# Patient Record
Sex: Male | Born: 2005 | Race: Black or African American | Hispanic: No | Marital: Single | Smoking: Former smoker
Health system: Southern US, Community
[De-identification: ages and names within clinical notes are randomized; demographics above are authoritative.]

---
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
## 2024-01-02 ENCOUNTER — Ambulatory Visit (HOSPITAL_COMMUNITY): Payer: Self-pay

## 2024-01-02 LAB — CYTOLOGY, (ORAL, ANAL, URETHRAL) ANCILLARY ONLY
Chlamydia: NEGATIVE
Comment: NEGATIVE
Comment: NEGATIVE
Comment: NORMAL
Neisseria Gonorrhea: NEGATIVE
Trichomonas: POSITIVE — AB

## 2024-01-02 MED ORDER — METRONIDAZOLE 500 MG PO TABS: 2000.0000 mg | ORAL_TABLET | Freq: Once | ORAL | 0 refills | Status: AC

## 2024-05-05 ENCOUNTER — Ambulatory Visit

## 2024-05-06 ENCOUNTER — Ambulatory Visit
Admission: EM | Admit: 2024-05-06 | Discharge: 2024-05-06 | Disposition: A | Discharge status: Home / Self Care | Attending: Family Medicine | Admitting: Family Medicine

## 2024-05-06 DIAGNOSIS — Z113 Encounter for screening for infections with a predominantly sexual mode of transmission: Secondary | ICD-10-CM | POA: Diagnosis not present

## 2024-05-06 DIAGNOSIS — Z202 Contact with and (suspected) exposure to infections with a predominantly sexual mode of transmission: Secondary | ICD-10-CM | POA: Insufficient documentation

## 2024-05-06 MED ORDER — METRONIDAZOLE 500 MG PO TABS: 500.0000 mg | ORAL_TABLET | Freq: Two times a day (BID) | ORAL | 0 refills | Status: DC

## 2024-05-06 NOTE — ED Provider Notes (Signed)
  [facility]  Note:  This document was prepared using Conservation officer, historic buildings and may include unintentional dictation errors.  MRN: [id number] DOB: [birth year]  Subjective:   Blake Gutierrez is a 18 y.o. male presenting for STI screening.  Patient had sex with someone that tested positive for trichomonas.  Denies dysuria, hematuria, urinary frequency, penile discharge, penile swelling, testicular pain, testicular swelling, anal pain, groin pain.  Refuses HIV and syphilis testing.  No chronic medications.  No Known Allergies  Past Medical History:  Diagnosis Date   Erb's palsy Birth   Occupational Therapy     History reviewed. No pertinent surgical history.  Family History  Problem Relation Age of Onset   Heart Problems Mother    Hypertension Mother    Hypertension Other    Cancer Other    Diabetes Other     Social History   Tobacco Use   Smoking status: Former    Types: E-cigarettes    Passive exposure: Yes   Smokeless tobacco: Never  Vaping Use   Vaping status: Former   Substances: Nicotine, THC, Flavoring  Substance Use Topics   Alcohol use: Never   Drug use: Yes    Types: Marijuana    Comment: occasionally    ROS   Objective:   Vitals: BP (!) 126/1 (BP Location: Right Arm)   Pulse 71   Temp 98.8 F (37.1 C) (Oral)   Resp 18   SpO2 96%   Physical Exam Constitutional:      General: He is not in acute distress.    Appearance: Normal appearance. He is well-developed and normal weight. He is not ill-appearing, toxic-appearing or diaphoretic.  HENT:     Head: Normocephalic and atraumatic.     Right Ear: External ear normal.     Left Ear: External ear normal.     Nose: Nose normal.     Mouth/Throat:     Pharynx: Oropharynx is clear.  Eyes:     General: No scleral icterus.       Right eye: No discharge.        Left eye: No discharge.     Extraocular Movements: Extraocular movements intact.  Cardiovascular:      Rate and Rhythm: Normal rate.  Pulmonary:     Effort: Pulmonary effort is normal.  Musculoskeletal:     Cervical back: Normal range of motion.  Neurological:     Mental Status: He is alert and oriented to person, place, and time.  Psychiatric:        Mood and Affect: Mood normal.        Behavior: Behavior normal.        Thought Content: Thought content normal.        Judgment: Judgment normal.     Assessment and Plan :   PDMP not reviewed this encounter.  1. Screen for STD (sexually transmitted disease)   2. Exposure to trichomonas    Will treat empirically for trichomonas given his exposure.  Labs pending, will treat as appropriate otherwise based off of results.  Counseled patient on potential for adverse effects with medications prescribed/recommended today, ER and return-to-clinic precautions discussed, patient verbalized understanding.    Christopher Savannah, PA-C 05/06/24 1020

## 2024-05-06 NOTE — Discharge Instructions (Signed)
 Avoid all forms of sexual intercourse (oral, vaginal, anal) for the next 7 days to avoid spreading/reinfecting or at least until we can see what kinds of infection results are positive.  Abstaining for 2 weeks would be better but at least 1 week is required.  We will let you know about your test results from the swab we did today and if you need any prescriptions for antibiotics or changes to your treatment from today.   Your test results should be available tomorrow.  Our results team will let you know if you need any kind of treatment for any positive test results beside what we are providing you today.

## 2024-05-06 NOTE — ED Triage Notes (Signed)
 Pt reports positive exposure to Trichomonas.Denies sx.

## 2024-05-07 LAB — CYTOLOGY, (ORAL, ANAL, URETHRAL) ANCILLARY ONLY
Chlamydia: NEGATIVE
Comment: NEGATIVE
Comment: NEGATIVE
Comment: NORMAL
Neisseria Gonorrhea: NEGATIVE
Trichomonas: POSITIVE — AB

## 2024-05-08 ENCOUNTER — Ambulatory Visit (HOSPITAL_BASED_OUTPATIENT_CLINIC_OR_DEPARTMENT_OTHER): Payer: Self-pay

## 2024-05-09 LAB — MISC LABCORP TEST (SEND OUT): Labcorp test code: 180076

## 2024-05-22 ENCOUNTER — Other Ambulatory Visit: Payer: Self-pay

## 2024-05-22 ENCOUNTER — Ambulatory Visit
Admission: EM | Admit: 2024-05-22 | Discharge: 2024-05-22 | Disposition: A | Discharge status: Home / Self Care | Attending: Internal Medicine | Admitting: Internal Medicine

## 2024-05-22 DIAGNOSIS — R0989 Other specified symptoms and signs involving the circulatory and respiratory systems: Secondary | ICD-10-CM

## 2024-05-22 DIAGNOSIS — J069 Acute upper respiratory infection, unspecified: Secondary | ICD-10-CM

## 2024-05-22 DIAGNOSIS — J029 Acute pharyngitis, unspecified: Secondary | ICD-10-CM | POA: Diagnosis not present

## 2024-05-22 DIAGNOSIS — R051 Acute cough: Secondary | ICD-10-CM

## 2024-05-22 LAB — POCT RAPID STREP A (OFFICE): Rapid Strep A Screen: NEGATIVE

## 2024-05-22 LAB — POC SOFIA SARS ANTIGEN FIA: SARS Coronavirus 2 Ag: NEGATIVE

## 2024-05-22 MED ORDER — FLUTICASONE PROPIONATE 50 MCG/ACT NA SUSP: 2.0000 | Freq: Every day | NASAL | 2 refills | Status: AC

## 2024-05-22 MED ORDER — PREDNISONE 20 MG PO TABS: 40.0000 mg | ORAL_TABLET | Freq: Every day | ORAL | 0 refills | Status: AC

## 2024-05-22 MED ORDER — PROMETHAZINE-DM 6.25-15 MG/5ML PO SYRP: 5.0000 mL | ORAL_SOLUTION | Freq: Three times a day (TID) | ORAL | 0 refills | Status: AC | PRN

## 2024-05-22 NOTE — ED Provider Notes (Signed)
 [facility]    CSN: [id number] Arrival date & time: 05/22/24  1251      History   Chief Complaint No chief complaint on file.   HPI Blake Gutierrez is a 18 y.o. male.   18 year old male who presents urgent care with complaints of sore throat, headache, chills, cough and congestion.  He reports that his symptoms started last night.  He reports that he was around a bunch of family on Sunday and is unsure if he was exposed to anything.  He reports that he has a very sore throat that hurts worse when he coughs.  He is also having pain around his jaw and a headache.  He denies any slurred speech, visual changes, facial paralysis.  He is having chills as well.  When he coughs he is also feeling like he has congestion in his chest.     Past Medical History:  Diagnosis Date   Erb's palsy Birth   Occupational Therapy    Patient Active Problem List   Diagnosis Date Noted   Penile discharge 03/11/2023   Erb's palsy     History reviewed. No pertinent surgical history.     Home Medications    Prior to Admission medications   Medication Sig Start Date End Date Taking? Authorizing Provider  fluticasone  (FLONASE ) 50 MCG/ACT nasal spray Place 2 sprays into both nostrils daily. 05/22/24  Yes Cathryn Gallery A, PA-C  predniSONE  (DELTASONE ) 20 MG tablet Take 2 tablets (40 mg total) by mouth daily with breakfast for 3 days. 05/22/24 05/25/24 Yes Litha Lamartina A, PA-C  promethazine -dextromethorphan (PROMETHAZINE -DM) 6.25-15 MG/5ML syrup Take 5 mLs by mouth every 8 (eight) hours as needed for cough. 05/22/24  Yes Aniya Jolicoeur A, PA-C  acetaminophen  (TYLENOL ) 325 MG tablet Take 2 tablets (650 mg total) by mouth every 6 (six) hours as needed. Patient not taking: Reported on 08/08/2022 03/22/18   Delwyn Damien PARAS, PA-C  emtricitabine -tenofovir  (TRUVADA) 200-300 MG tablet Take 1 tablet by mouth daily. 03/15/23   Marlee Lynwood NOVAK, MD  ibuprofen  (ADVIL ,MOTRIN ) 600 MG tablet Take  1 tablet (600 mg total) by mouth every 6 (six) hours as needed. Patient not taking: Reported on 08/08/2022 03/22/18   Delwyn Damien PARAS, PA-C  methocarbamol  (ROBAXIN ) 500 MG tablet Take 2 tablets (1,000 mg total) by mouth 3 (three) times daily as needed for muscle spasms. Patient not taking: Reported on 08/08/2022 06/07/22   Marlee Lynwood NOVAK, MD  ondansetron  (ZOFRAN -ODT) 4 MG disintegrating tablet Take 1 tablet (4 mg total) by mouth every 8 (eight) hours as needed for nausea or vomiting. 10/09/23   Aurea Ethel NOVAK, NP    Family History Family History  Problem Relation Age of Onset   Heart Problems Mother    Hypertension Mother    Hypertension Other    Cancer Other    Diabetes Other     Social History Social History   Tobacco Use   Smoking status: Former    Types: E-cigarettes    Passive exposure: Yes   Smokeless tobacco: Never  Vaping Use   Vaping status: Former   Substances: Nicotine, THC, Flavoring  Substance Use Topics   Alcohol use: Never   Drug use: Yes    Types: Marijuana    Comment: occasionally     Allergies   Patient has no known allergies.   Review of Systems Review of Systems  Constitutional:  Positive for chills. Negative for fever.  HENT:  Positive for congestion and sore throat. Negative  for ear pain.   Eyes:  Negative for pain and visual disturbance.  Respiratory:  Positive for cough. Negative for shortness of breath.   Cardiovascular:  Negative for chest pain and palpitations.  Gastrointestinal:  Negative for abdominal pain and vomiting.  Genitourinary:  Negative for dysuria and hematuria.  Musculoskeletal:  Negative for arthralgias and back pain.  Skin:  Negative for color change and rash.  Neurological:  Positive for headaches. Negative for seizures and syncope.  All other systems reviewed and are negative.    Physical Exam Triage Vital Signs ED Triage Vitals  Encounter Vitals Group     BP 05/22/24 1303 116/79     Girls Systolic BP  Percentile --      Girls Diastolic BP Percentile --      Boys Systolic BP Percentile --      Boys Diastolic BP Percentile --      Pulse Rate 05/22/24 1303 76     Resp 05/22/24 1303 17     Temp 05/22/24 1303 97.9 F (36.6 C)     Temp Source 05/22/24 1303 Oral     SpO2 05/22/24 1303 97 %     Weight --      Height --      Head Circumference --      Peak Flow --      Pain Score 05/22/24 1302 7     Pain Loc --      Pain Education --      Exclude from Growth Chart --    No data found.  Updated Vital Signs BP 116/79   Pulse 76   Temp 97.9 F (36.6 C) (Oral)   Resp 17   SpO2 97%   Visual Acuity Right Eye Distance:   Left Eye Distance:   Bilateral Distance:    Right Eye Near:   Left Eye Near:    Bilateral Near:     Physical Exam Vitals and nursing note reviewed.  Constitutional:      General: He is not in acute distress.    Appearance: He is well-developed.  HENT:     Head: Normocephalic and atraumatic.     Right Ear: Tympanic membrane normal.     Left Ear: Tympanic membrane normal.     Nose: Nose normal.     Mouth/Throat:     Mouth: Mucous membranes are moist.     Pharynx: Posterior oropharyngeal erythema present.  Eyes:     Extraocular Movements: Extraocular movements intact.     Conjunctiva/sclera: Conjunctivae normal.     Pupils: Pupils are equal, round, and reactive to light.  Cardiovascular:     Rate and Rhythm: Normal rate and regular rhythm.     Heart sounds: No murmur heard. Pulmonary:     Effort: Pulmonary effort is normal. No respiratory distress.     Breath sounds: Normal breath sounds.  Abdominal:     Palpations: Abdomen is soft.     Tenderness: There is no abdominal tenderness.  Musculoskeletal:        General: No swelling.     Cervical back: Neck supple.  Skin:    General: Skin is warm and dry.     Capillary Refill: Capillary refill takes less than 2 seconds.  Neurological:     General: No focal deficit present.     Mental Status: He is  alert and oriented to person, place, and time.     Cranial Nerves: No cranial nerve deficit.  Psychiatric:  Mood and Affect: Mood normal.        Behavior: Behavior normal.        Thought Content: Thought content normal.        Judgment: Judgment normal.      UC Treatments / Results  Labs (all labs ordered are listed, but only abnormal results are displayed) Labs Reviewed  POCT RAPID STREP A (OFFICE)  POC SOFIA SARS ANTIGEN FIA    EKG   Radiology No results found.  Procedures Procedures (including critical care time)  Medications Ordered in UC Medications - No data to display  Initial Impression / Assessment and Plan / UC Course  I have reviewed the triage vital signs and the nursing notes.  Pertinent labs & imaging results that were available during my care of the patient were reviewed by me and considered in my medical decision making (see chart for details).     Viral upper respiratory tract infection with cough  Sore throat - Plan: POCT rapid strep A, POCT rapid strep A  Acute cough - Plan: POC SARS Coronavirus 2 Ag, POC SARS Coronavirus 2 Ag  Chest congestion - Plan: POC SARS Coronavirus 2 Ag, POC SARS Coronavirus 2 Ag   Strep testing and COVID testing done today.  These are negative. Symptoms are most consistent with a viral infection.  Physical exam findings and vital signs are reassuring.  This does not require antibiotic treatment.  We focus treatment on improving the symptoms.  We will treat with the following:  Prednisone  40 mg (2 tablets) once daily for 3 days. Take this in the morning.  This is a steroid to help with inflammation and pain.  Do not take ibuprofen  while you are taking this medication.  It is okay to take Tylenol  Promethazine  DM 5 mL every 8 hours as needed for cough.  Use caution as this medication can cause drowsiness. Flonase  2 sprays each nostril once daily for 7 days for nasal congestion.  May use as needed after this.  Make sure  to stay hydrated by drinking plenty of water. Return to urgent care or PCP if symptoms worsen or fail to resolve.    Final Clinical Impressions(s) / UC Diagnoses   Final diagnoses:  Sore throat  Acute cough  Chest congestion  Viral upper respiratory tract infection with cough     Discharge Instructions      Strep testing and COVID testing done today.  These are negative. Symptoms are most consistent with a viral infection.  Physical exam findings and vital signs are reassuring.  This does not require antibiotic treatment.  We focus treatment on improving the symptoms.  We will treat with the following:  Prednisone  40 mg (2 tablets) once daily for 3 days. Take this in the morning.  This is a steroid to help with inflammation and pain.  Do not take ibuprofen  while you are taking this medication.  It is okay to take Tylenol  Promethazine  DM 5 mL every 8 hours as needed for cough.  Use caution as this medication can cause drowsiness. Flonase  2 sprays each nostril once daily for 7 days for nasal congestion.  May use as needed after this.  Make sure to stay hydrated by drinking plenty of water. Return to urgent care or PCP if symptoms worsen or fail to resolve.       ED Prescriptions     Medication Sig Dispense Auth. Provider   promethazine -dextromethorphan (PROMETHAZINE -DM) 6.25-15 MG/5ML syrup Take 5 mLs by mouth every 8 (  eight) hours as needed for cough. 180 mL Lekeya Rollings A, PA-C   predniSONE  (DELTASONE ) 20 MG tablet Take 2 tablets (40 mg total) by mouth daily with breakfast for 3 days. 6 tablet Maverick Patman A, PA-C   fluticasone  (FLONASE ) 50 MCG/ACT nasal spray Place 2 sprays into both nostrils daily. 9.9 mL Teresa Almarie LABOR, [place]      PDMP not reviewed this encounter.   Teresa Almarie LABOR, PA-C 05/22/24 1337

## 2024-05-22 NOTE — ED Triage Notes (Signed)
 Pt c/o HA, watery eyes, throat swelling, chills started last night

## 2024-05-22 NOTE — Discharge Instructions (Addendum)
 Strep testing and COVID testing done today.  These are negative. Symptoms are most consistent with a viral infection.  Physical exam findings and vital signs are reassuring.  This does not require antibiotic treatment.  We focus treatment on improving the symptoms.  We will treat with the following:  Prednisone  40 mg (2 tablets) once daily for 3 days. Take this in the morning.  This is a steroid to help with inflammation and pain.  Do not take ibuprofen  while you are taking this medication.  It is okay to take Tylenol  Promethazine  DM 5 mL every 8 hours as needed for cough.  Use caution as this medication can cause drowsiness. Flonase  2 sprays each nostril once daily for 7 days for nasal congestion.  May use as needed after this.  Make sure to stay hydrated by drinking plenty of water. Return to urgent care or PCP if symptoms worsen or fail to resolve.

## 2024-05-31 ENCOUNTER — Ambulatory Visit: Admission: EM | Admit: 2024-05-31 | Discharge: 2024-05-31 | Disposition: A | Discharge status: Home / Self Care

## 2024-05-31 NOTE — ED Notes (Signed)
 Pt was called by RN no response.

## 2024-06-06 ENCOUNTER — Ambulatory Visit: Admitting: Family Medicine

## 2024-06-06 NOTE — Progress Notes (Deleted)
° ° °  SUBJECTIVE:   CHIEF COMPLAINT / HPI:   ***   Erb's Palsy - seen 12/2022 Presenting with worsening pain and limited mobility. Feel that this will require long-term physical therapy and may eventually need referral to PMR/Ortho for further evaluation/treatment. 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  OBJECTIVE:   There were no vitals taken for this visit.  General: ***-appearing, no acute distress. HEENT: normocephalic, PERRLA, EOM grossly intact, MMM, bilateral TM visualized without erythema or bulging. Cardio: Regular rate, *** rhythm, no murmurs on exam. Pulm: Clear, no wheezing, no crackles. No increased work of breathing. Abdominal: bowel sounds present, soft, non-tender, non-distended. No HSM. Extremities: no peripheral edema. Moves all extremities equally. Neuro: Alert and oriented x3, speech normal in content, no facial asymmetry, strength intact and equal bilaterally in UE and LE, pupils equal and reactive to light.  Psych:  Cognition and judgment appear intact. Alert, communicative, and cooperative with normal attention span and concentration. No apparent delusions, illusions, hallucinations    ASSESSMENT/PLAN:   Assessment & Plan      Lauraine Norse, DO [facility]

## 2024-06-27 ENCOUNTER — Encounter: Payer: Self-pay | Admitting: Family Medicine

## 2024-06-27 ENCOUNTER — Ambulatory Visit (INDEPENDENT_AMBULATORY_CARE_PROVIDER_SITE_OTHER): Admitting: Family Medicine

## 2024-06-27 DIAGNOSIS — F321 Major depressive disorder, single episode, moderate: Secondary | ICD-10-CM

## 2024-06-27 MED ORDER — DULOXETINE HCL 20 MG PO CPEP: 20.0000 mg | ORAL_CAPSULE | Freq: Every day | ORAL | 0 refills | Status: AC

## 2024-06-27 NOTE — Assessment & Plan Note (Signed)
-   main cause of distress - trial cymbalta 20 mg for nerve pain - amb referral to PT/OT - follow up in 2 weeks - patient will also benefit from accomodations for employment, discussed that this would be job specific and to send the forms when he finds his next job

## 2024-06-27 NOTE — Patient Instructions (Addendum)
 Safety Planning  Identifying Signs and Triggers:  Things that make thoughts worse- seeing people doing things you want to be able to do How to avoid situations: walk away when you can   Coping Strategies:  Things that help patient feel better: being around family, watching tiktoks for distraction Goals for implementation: spend time with family when you arrive home.   Distractions:  Identify people that can help distract from thoughts: tiktoks  Identify social situations or places that help distract: avoid TikToks about fitness content  People to ask for help:  List:  Jaquil Dragan [phone]  Reasons to keep living:  List:  Mom and younger brother  Therapy Resources:  Please go to:  [facility] [address]  [place], [place] [address] [phone] Urgent psychiatry (medication management) Monday-Thursday 8-11AM.   It is highly recommended that you show up at 7/730 because it is first come first serve. For urgent therapy (not medication) Walk in hours are 8-1pm Monday through Wednesday (please come at 7/730 to ensure you are seen)  [email or web address]

## 2024-06-27 NOTE — Progress Notes (Signed)
" ° ° °  SUBJECTIVE:   CHIEF COMPLAINT / HPI:   Blake Gutierrez is a 18 y.o. 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   Risk Factors:  Other mental health disorders: none On/Off Prescribed medications: none Prior Attempts: none Family History: Grandma on antidepressants,  Access to Lethal Means: none Social Isolation: no Co-Morbid Health Conditions: Erb's Palsy Delusions/Hallucinations (command hallucinations): None Hx of Substance abuse: occasional marijuana use on weekend Decreased/Increased Appetite: none Decreased/Increased Sleep: some insomnia due to arm pain  Protective Factors:  Family Support: yes Engaged in Outpatient Treatment: no Cultural/Religious Connections: none No access to lethal means: yes  Overall Risk: chronic v. Active suicidal mode   Assess Ideation:  In the past 2 days, week, months have you had thoughts of killing yourself: yes in the last 2 weeks How often do thoughts occur: rarely When do they have thoughts, are they interfering with life: sometimes In the past few weeks, have you wished you were dead: yes In the past few weeks have you felt that you or your family would be better off if you were dead: yes Are you have thoughts of killing yourself right now: no  Assess Plan:  Do you have a plan to kill yourself: no If you were going to kill yourself, how would you do it: Unsure  Assess Behaviors:  Have you ever tried to hurt yourself and how did you do it: no Nature/outcome of past attempts: no prior attempts Self-Injurious Behaviors: none  Intent:  What are some reasons you would NOT kill your yourself: Mom and younger brother What are some reasons you WOULD kill yourself: having trouble finding and keeping a job, not being able to do things that other people can do. Not able to join the military due to his disability. Being limited because of his arm Do you expect to carry out your plan: NA Do you believe your actions will be  lethal: NA   PERTINENT  PMH / PSH: reviewed and updated.  OBJECTIVE:   BP (!) 142/78   Pulse 82   Ht 6' 2 (1.88 m)   Wt 230 lb 12.8 oz (104.7 kg)   SpO2 100%   BMI 29.63 kg/m   Well-appearing, no acute distress Cardio: Regular rate, regular rhythm, no murmurs on exam. Pulm: Clear, no wheezing, no crackles. No increased work of breathing Neuro: alert and oriented x3, speech normal in content Psych:  Cognition and judgment appear intact. Alert, communicative  and cooperative with normal attention span and concentration. 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   Lucie Pinal, DO [facility]    "

## 2024-07-12 ENCOUNTER — Ambulatory Visit

## 2024-07-18 ENCOUNTER — Other Ambulatory Visit: Payer: Self-pay

## 2024-07-18 ENCOUNTER — Ambulatory Visit: Attending: Family Medicine | Admitting: Physical Therapy

## 2024-07-18 ENCOUNTER — Ambulatory Visit: Admitting: Occupational Therapy

## 2024-07-18 ENCOUNTER — Encounter: Payer: Self-pay | Admitting: Physical Therapy

## 2024-07-18 ENCOUNTER — Encounter: Payer: Self-pay | Admitting: Occupational Therapy

## 2024-07-18 DIAGNOSIS — M25512 Pain in left shoulder: Secondary | ICD-10-CM | POA: Insufficient documentation

## 2024-07-18 DIAGNOSIS — M79602 Pain in left arm: Secondary | ICD-10-CM

## 2024-07-18 DIAGNOSIS — R29818 Other symptoms and signs involving the nervous system: Secondary | ICD-10-CM | POA: Insufficient documentation

## 2024-07-18 DIAGNOSIS — M25622 Stiffness of left elbow, not elsewhere classified: Secondary | ICD-10-CM | POA: Diagnosis present

## 2024-07-18 DIAGNOSIS — R278 Other lack of coordination: Secondary | ICD-10-CM | POA: Insufficient documentation

## 2024-07-18 DIAGNOSIS — R201 Hypoesthesia of skin: Secondary | ICD-10-CM | POA: Insufficient documentation

## 2024-07-18 DIAGNOSIS — R29898 Other symptoms and signs involving the musculoskeletal system: Secondary | ICD-10-CM | POA: Diagnosis present

## 2024-07-18 DIAGNOSIS — M6281 Muscle weakness (generalized): Secondary | ICD-10-CM

## 2024-07-18 DIAGNOSIS — R208 Other disturbances of skin sensation: Secondary | ICD-10-CM | POA: Diagnosis present

## 2024-07-18 DIAGNOSIS — M62838 Other muscle spasm: Secondary | ICD-10-CM | POA: Insufficient documentation

## 2024-07-18 DIAGNOSIS — G8929 Other chronic pain: Secondary | ICD-10-CM | POA: Diagnosis present

## 2024-07-18 DIAGNOSIS — M24522 Contracture, left elbow: Secondary | ICD-10-CM | POA: Insufficient documentation

## 2024-07-18 NOTE — Therapy (Signed)
 " OUTPATIENT PHYSICAL THERAPY SHOULDER EVALUATION   Patient Name: Blake Gutierrez MRN: [id number] DOB:Aug 21, 2005, 19 y.o., male Today's Date: 07/18/2024  END OF SESSION:  PT End of Session - 07/18/24 1542     Visit Number 1    Number of Visits 9   8 + eval   Date for Recertification  09/28/24   pushed out due to potential scheduling delay w/ multi-D needs   Authorization Type [employer]    PT Start Time 1537    PT Stop Time 1615    PT Time Calculation (min) 38 min    Activity Tolerance Patient limited by pain    Behavior During Therapy Flat affect          Past Medical History:  Diagnosis Date   Erb's palsy Birth   Occupational Therapy   History reviewed. No pertinent surgical history. Patient Active Problem List   Diagnosis Date Noted   Penile discharge 03/11/2023   Erb's palsy     PCP: Lafe Domino, DO  REFERRING PROVIDER: Delores Suzann HERO, MD  REFERRING DIAG: P14.0 (ICD-10-CM) - Erb's palsy  THERAPY DIAG:  Pain in left arm  Chronic left shoulder pain  Other symptoms and signs involving the nervous system  Other symptoms and signs involving the musculoskeletal system  Other lack of coordination  Other muscle spasm  Hypoesthesia of skin  Rationale for Evaluation and Treatment: Rehabilitation  ONSET DATE: Birth  SUBJECTIVE:                                                                                                                                                                                      SUBJECTIVE STATEMENT: Pt reports he does most everything with RUE because he cannot elevate L shoulder.  He reports a lot of pain and muscle spasms/jumping between the left elbow and shoulder/deltoid.  Pain wakes him in the middle of the night and makes it hard to return to sleep. Hand dominance: Right Friend dropped pt off as he does not drive.  PERTINENT HISTORY: Erb's Palsy, has had a single seizure in 2024 and none since  PAIN:  Are you having  pain? Yes: NPRS scale: 6 Pain location: between left deltoid and elbow Pain description: throbbing, jumping  Aggravating factors: mornings Relieving factors: nerve medicine  PRECAUTIONS: None  RED FLAGS: None   WEIGHT BEARING RESTRICTIONS: No  FALLS:  Has patient fallen in last 6 months? 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  DIAGNOSTIC FINDINGS:  No recent relevant imaging.  PATIENT SURVEYS:  PSFS: THE PATIENT SPECIFIC FUNCTIONAL SCALE  Place score of 0-10 (0 = unable to perform activity and 10 = able to perform activity at the same level as before injury or problem)  Activity Date: 07/18/2024    Pushup (modified or regular) 0    2. Picking up a bag of trash 2    3.Using L arm to help bathe 0    4.      Total Score 2/30      Total Score = Sum of activity scores/number of activities  Minimally Detectable Change: 3 points (for single activity); 2 points (for average score)  [facility] (nd). The Patient Specific Functional Scale . Retrieved from [email or web address]   COGNITION: Overall cognitive status: Within functional limits for tasks assessed     SENSATION: Light touch: WFL and pt reports diminished compared to RUE; poor proprioception  POSTURE: L shoulder rounded and mildly abducted  UPPER EXTREMITY ROM:   Passive ROM Right eval Left eval  Shoulder flexion WNL 70  Shoulder extension    Shoulder abduction WNL 60  Shoulder adduction    Shoulder internal  rotation    Shoulder external rotation WNL 4  Elbow flexion    Elbow extension    Wrist flexion    Wrist extension    Wrist ulnar deviation    Wrist radial deviation    Wrist pronation    Wrist supination    (Blank rows = not tested) *Pain at all L end range; can elevate into scaption about 45 deg actively before pain  UPPER EXTREMITY MMT:  MMT Right eval Left eval  Shoulder flexion Not assessed due to pain at rest and limited ROM  Shoulder extension   Shoulder abduction   Shoulder adduction   Shoulder internal rotation   Shoulder external rotation   Middle trapezius   Lower trapezius   Elbow flexion   Elbow extension   Wrist flexion   Wrist extension   Wrist ulnar deviation   Wrist radial deviation   Wrist pronation   Wrist supination   Grip strength (lbs) 153.6, 149.6, 139.7 lbs = 147.63 avg 18.5, 17.8, 21.3 lbs = 19.2 avg  (Blank rows = not tested)  SHOULDER SPECIAL TESTS: None appropriate due to congenital deficit.  JOINT MOBILITY TESTING:  Not tolerated due to pain.  PALPATION:  Notable atrophy in posterior L shoulder, tender to palpation of left GHJ and over general deltoid region, palpable muscle spasms in deltoid and biceps; noted possible sublux but pain limited full assessment of depth                                                                                                                      TREATMENT  DATE: 07/18/2024   PATIENT EDUCATION: Education details: Chronicity of deficits and impact to progress.  Coordination of care w/ OT and shared 27 visits.  Modifications to meet goals.  PT POC, assessments used and goals to be set. Person educated: Patient Education method: Explanation Education comprehension: verbalized understanding and needs further education  HOME EXERCISE PROGRAM: To be established.  ASSESSMENT:  CLINICAL IMPRESSION: Patient is a 19 y.o. male who was seen today for physical therapy evaluation and treatment for Erb's  Palsy.  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  Identified impairments include elbow contracture, limited passive and active L shoulder mobility, pain at rest and with handling in L shoulder, difficulty engaging LUE to daily tasks, poor dexterity and motor control of LUE, and severely diminished L compared to R grip strength.  He would benefit from skilled PT to address impairments as noted and progress towards long term goals.  OBJECTIVE IMPAIRMENTS: decreased activity tolerance, decreased coordination, decreased knowledge of condition, decreased ROM, decreased strength, hypomobility, increased fascial restrictions, increased muscle spasms, impaired flexibility, impaired sensation, impaired tone, impaired UE functional use, improper body mechanics, postural dysfunction, and pain.   ACTIVITY LIMITATIONS: carrying, lifting, bed mobility, bathing, toileting, dressing, reach over head, and hygiene/grooming  PARTICIPATION LIMITATIONS: meal prep, cleaning, laundry, driving, shopping, community activity, occupation, and yard work  PERSONAL FACTORS: Behavior pattern, Past/current experiences, Time since onset of injury/illness/exacerbation, and 1 comorbidity: Erb's Palsy are also affecting patient's functional outcome.   REHAB POTENTIAL: Fair chronicity, not wearing splint for contracture, chronic pain and nerve damage  CLINICAL DECISION MAKING: Stable/uncomplicated  EVALUATION COMPLEXITY: Low   GOALS: Goals reviewed with patient? Yes  SHORT TERM GOALS: Target date: 08/17/2024  Pt will be independent and compliant with ind and caregiver assist based stretching HEP in order to maintain functional progress and improve mobility. Baseline:  To be established. Goal status: INITIAL  LONG TERM GOALS: Target date: 09/14/2024  Pt will be independent and compliant with advanced and finalized LUE strength, positioning, and stretching HEP in order  to maintain functional progress and improve mobility. Baseline: To be established. Goal status: INITIAL  2.  Pt will demonstrate a 5lb improvement in grip strength average to demonstrate improved functional grip. Baseline: 19.2 avg of 3 trials Goal status: INITIAL  3.  Pt will report 2 strategies for positioning in bed in order to improve pain management. Baseline: Discussed on eval. Goal status: INITIAL  4.  Pt will be independent with desensitization strategies for the shoulder and LUE in order to better manage pain. Baseline: To be provided. Goal status: INITIAL  5.  Pt will safely perform active assist lifting style task from floor level w/ >/=10lb in order to improve functional capacity of the LUE. Baseline: Unable to lift from floor Goal status: INITIAL  PLAN:  PT FREQUENCY: 1x/week  PT DURATION: 8 weeks  PLANNED INTERVENTIONS: 97164- PT Re-evaluation, 97750- Physical Performance Testing, 97110-Therapeutic exercises, 97530- Therapeutic activity, [id number]- Neuromuscular re-education, 97535- Self Care, [id number]- Manual therapy, [id number]- Aquatic Therapy, [id number]- Electrical stimulation (manual), [id number]- Ultrasound, 79439 (1-2 muscles), 20561 (3+ muscles)- Dry Needling, Patient/Family education, Taping, Joint mobilization, Cryotherapy, and Moist heat  PLAN FOR NEXT SESSION: Desensitization strategies.  Grade 1/2 mods of L shoulder. Approximation of the shoulder.  Attempt AAROM for shoulder w/ table slides/deltoid activation.  Light lifting <5lbs w/ LUE from waist level first.  Modified plantigrade in elbow flexion?  TENS on L shoulder?  Check all possible CPT codes:  See Planned Interventions List for Planned CPT Codes    Check all conditions that are expected to impact treatment: Contractures, spasticity or fracture relevant to requested treatment and Neurological condition and/or seizures   If treatment provided at initial evaluation, no treatment charged due to lack of authorization.     Daved KATHEE Bull, PT, DPT 07/18/2024, 5:47 PM  "

## 2024-07-18 NOTE — Therapy (Signed)
 " OUTPATIENT OCCUPATIONAL THERAPY NEURO EVALUATION  Patient Name: Blake Gutierrez MRN: [id number] DOB:04-08-2006, 19 y.o., male Today's Date: 07/18/2024  PCP: Lafe Domino, DO  REFERRING PROVIDER: Delores Suzann HERO, MD   END OF SESSION:  OT End of Session - 07/18/24 1446     Visit Number 1    Number of Visits 10    Date for Recertification  09/28/24    Authorization Type [employer] [id number]    Authorization Time Period VL: 27 AUTH REQUIRED    OT Start Time 1448    OT Stop Time 1530    OT Time Calculation (min) 42 min    Equipment Utilized During Treatment Testing Material    Activity Tolerance Patient limited by pain    Behavior During Therapy Flat affect          Past Medical History:  Diagnosis Date   Erb's palsy Birth   Occupational Therapy   History reviewed. No pertinent surgical history. 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 He reports a lot of pain, muscle spasms and jumping between the left elbow and shoulder/deltoid. He also reports that pain wakes him in the middle of the night and makes it hard to return to sleep.   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  Pt unable to report prior therapies but OT notes multiple mentions of OT referrals in chart.  Services may have been provided with another agency/school.  Had a single seizure in 2024 and none since   MR review reveals several MD visits over the years re: worsening pain and limited mobility. At least one note stated: Feel that this will require long-term physical therapy and may eventually need referral to PMR/Ortho for further evaluation/treatment. For now, will place referral to PT and start baclofen  3 times daily for his contracture.  Most recent note 06/27/24: due to SI with Erb's Palsy limitations as a contributing factor and MD noting:   - main cause of distress - trial cymbalta  20 mg for nerve pain - amb referral to PT/OT - follow up in 2 weeks - patient will also benefit from accomodations for employment, discussed that this would be job specific and to send the forms when he finds his next job  PRECAUTIONS: None  WEIGHT BEARING RESTRICTIONS: No  PAIN:  Are you having pain? Yes: NPRS scale: 6 Pain location: deltoid to elbow Pain description: muscle twitches and shocks; throbbing/jumping Aggravating factors: sleeping/AM worse, weather - rain/snow, cold Relieving factors: Cymbalta , AM - discomfort   FALLS: Has patient fallen in last 6 months? 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  OBJECTIVE:  Note: Objective measures were completed at Evaluation unless otherwise noted.  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 Meal Prep: TBA Community mobility: Ind Medication management: Ind Landscape architect: Ind Handwriting: WNL  MOBILITY STATUS: Independent  POSTURE COMMENTS:  L UE shoulder subluxation and resulting posture changes Sitting balance: WNL  ACTIVITY TOLERANCE: Activity tolerance: LUE - limited; tries to avoid using L arm with any tasks  FUNCTIONAL OUTCOME MEASURES: Quick Dash: Based on Clinical judgement of evaluating therapist - 65.9%  UPPER EXTREMITY ROM:    Active ROM Right eval Left eval  Shoulder flexion  70  Shoulder abduction    Shoulder adduction  60  Shoulder extension    Shoulder internal rotation    Shoulder external rotation    Elbow flexion  125  Elbow extension  90  Wrist flexion 65 20  Wrist extension    Wrist ulnar deviation    Wrist radial deviation    Wrist pronation  Limited  Wrist supination  Limited  (Blank rows = not tested)  UPPER EXTREMITY MMT:     MMT Right eval Left eval  Shoulder flexion 4 2-  Shoulder abduction    Shoulder adduction    Shoulder extension    Shoulder internal rotation    Shoulder external rotation    Middle trapezius    Lower trapezius    Elbow flexion 5   Elbow extension  Limited by contracture  Wrist flexion  2-  Wrist extension  3-  Wrist ulnar deviation    Wrist radial deviation    Wrist pronation    Wrist supination    (Blank rows = not tested)  HAND FUNCTION: Grip strength: Right: 128.0 lbs; Left: 22.9 lbs (1 trial only)   COORDINATION: Box and Blocks:  Right 55 blocks, Left 22  blocks Pt unable to lift L arm over center wall - had to rest arm on edge of box and crawl hand up to get blocks over   SENSATION: Diminished on L arm compared to RUE - specifics TBD as he also has some hypersensitivity to touch due to pain  EDEMA: NA  MUSCLE TONE:  LUE - hypertonicity in elbow (90* flexion contracture); low tone shoulder/subluxation  COGNITION: Overall cognitive status: Within functional limits for tasks assessed Pt with flat affect  OBSERVATIONS: Pt ambulates with no AE and no loss of balance. Odor noted during evaluation (will need to explore possible reasons); patient remained alert and able to follow directions throughout session.  Pt presents with L arm flexed in his lap with notable atrophy in posterior L shoulder and biceps upon removal of his hoody ie) R bicep is well defined and L bicep with obvious atrophy: Biceps RUE 22 LUE 21; hand  length RUE 13.5 and LUE 12                                                                                                                           TODAY'S TREATMENT:    OT educated pt on rehabilitation process and results of objective measures in relation to pt specific goals.   Education provided re: positioning arm with LUE support to decreased shoulder subluxation and strain on nerves that results in pain.  Treatment time not billed as insurance does not reimburse for treatment provided at evaluation.   PATIENT EDUCATION: Education details: OT role and POC Considerations. Chronicity of deficits and impact to progress.   Person educated: Patient Education method: Explanation, Demonstration, and Verbal cues Education comprehension: verbalized understanding and needs further education  HOME EXERCISE PROGRAM: TBD   GOALS: Goals reviewed with patient? Yes  SHORT TERM GOALS: Target date: 08/24/24  Target date: 08/24/24  Patient will demonstrate initial LUE home exercise program (land-based) focusing on ROM,  positioning, and tone management with <=25% verbal cues for proper execution to promote carryover and reduce non-use. Baseline: New to outpt OT; grip strength  Right: 128.0 lbs; Left: 22.9 lbs Goal status: INITIAL  2.  Patient will verbalize understanding of at least 2 splinting options (e.g., static progressive elbow extension, nighttime positioning orthosis) including purpose, wear schedule, and precautions for management of L elbow contracture. Baseline: Previously had a brace per MR review; no current splint use or understanding. Goal status: INITIAL  3.  Patient will have necessary materials and verbalize understanding of options to help with job modifications ([employer]) and possible driving modifications. Baseline: Limited awareness and does not drive due to inability to put 2 hands on steering wheel. Goal Status: INITIAL   4.  Patient will verbalize understanding of an initial aquatic therapy home program (e.g., buoyancy-assisted ROM, gentle elbow extension, shoulder support positioning) including safety considerations and frequency. Baseline: No aquatic program; high pain and tone/contracture limiting AROM participation. Goal status: INITIAL   LONG TERM GOALS: Target date: 09/28/24  Patient will demonstrate updated LUE home exercise programs (land/aquatic-based) focusing on increased ROM, positioning, and tone management with visual cues/handouts only for proper execution to promote carryover and reduce non-use. Baseline: New to outpt OT - Elbow contracture at ~90 flexion; wrist flexion 20. Goal status: INITIAL  2.  Patient will improve LUE coordination as evidenced by >=5 block improvement on Box and Blocks Test and ability to transfer items without resting arm on divider for entire task. Baseline: Right: 55 blocks Left: 22 blocks; requires compensatory positioning. Goal status: INITIAL  3.  Patient will incorporate LUE as a functional assist during 1-2 self-care or tabletop tasks  for at least 5 minutes with verbal encouragement, without task abandonment due to pain or frustration. Baseline: Severe LUE non-use; performs most tasks exclusively with RUE. Pt rates use of LUE 0/10 with bathing Goal status: INITIAL  4. Patient will identify and utilize at least 2 pain  or sensory modulation strategies (e.g., positioning/Ktape, deep pressure, temperature considerations, pain relieving cream, pacing) to manage LUE pain or hypersensitivity during daily tasks. Baseline: Reports pain 6/10 with spasms and shocks; pain disrupts sleep and function. Goal status: INITIAL  ASSESSMENT:  CLINICAL IMPRESSION: Patient is a 19 y.o. 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 Hx includes recent SI r/t LUE deficits. Patient currently presents below baseline level of function with LUE and demonstrates severe non-use and functional deficits and impairments. Identified impairments include elbow contracture, limited passive and active L shoulder mobility, pain at rest and with handling in L UE, difficulty engaging LUE to daily tasks, poor ability to perform table top activities and motor control of LUE, with severely limited LUE grip strength compared to RUE. Pt would benefit from skilled OT services in the outpatient setting to work on impairments as noted below to help pt return to PLOF as able.    PERFORMANCE DEFICITS: in functional skills including ADLs, IADLs, coordination, dexterity, sensation, tone, ROM, strength, pain, fascial restrictions, muscle spasms, flexibility, Fine motor control, Gross motor control, endurance, decreased knowledge of precautions, decreased knowledge of use of DME, and UE functional use, cognitive skills including emotional, and psychosocial skills including coping strategies.   IMPAIRMENTS: are limiting patient from ADLs, IADLs, rest and sleep, work, leisure, and social participation.    CO-MORBIDITIES: has no other co-morbidities that affects occupational performance. Patient will benefit from skilled OT to address above impairments and improve overall function.  MODIFICATION OR ASSISTANCE TO COMPLETE EVALUATION: Min-Moderate modification of tasks or assist with assess necessary to complete an evaluation.  OT OCCUPATIONAL PROFILE AND HISTORY: Detailed assessment: Review of records and additional review of physical, cognitive, psychosocial history related to current functional performance.  CLINICAL DECISION MAKING: Moderate - several treatment options, min-mod task modification necessary  REHAB POTENTIAL: Fair chronicity of deficits - nerve damage & severity of contracture  EVALUATION COMPLEXITY: Moderate    PLAN:  OT FREQUENCY: 1-2x/week  OT DURATION: 8 weeks  PLANNED INTERVENTIONS: 97168 OT Re-evaluation, 97535 self care/ADL training, 02889 therapeutic exercise, 97530 therapeutic activity, 97112 neuromuscular re-education, 97140 manual therapy, [id number] aquatic therapy, 97035 ultrasound, 97039 fluidotherapy, 97032 electrical stimulation (manual), 97014 electrical stimulation unattended, 97760 Orthotic Initial, 97763 Orthotic/Prosthetic subsequent, passive range of motion, psychosocial skills training, coping strategies training, patient/family education, and DME and/or AE instructions  RECOMMENDED OTHER SERVICES: PT eval same day as OT eval; may need PMR/Botox/Baclofen   CONSULTED AND AGREED WITH PLAN OF CARE: Patient  PLAN FOR NEXT SESSION:  Discuss Aquatic therapy Community resources K taping shoulder HEP - table top slides Pain management options  Clarita LITTIE Pride, OT 07/18/2024, 5:35 PM           "

## 2024-08-08 ENCOUNTER — Ambulatory Visit: Admitting: Physical Therapy

## 2024-08-08 ENCOUNTER — Ambulatory Visit: Admitting: Occupational Therapy

## 2024-08-15 ENCOUNTER — Ambulatory Visit: Admitting: Physical Therapy

## 2024-08-15 ENCOUNTER — Ambulatory Visit: Admitting: Occupational Therapy

## 2024-08-22 ENCOUNTER — Ambulatory Visit: Admitting: Physical Therapy

## 2024-08-22 ENCOUNTER — Ambulatory Visit: Admitting: Occupational Therapy

## 2024-08-29 ENCOUNTER — Ambulatory Visit: Admitting: Occupational Therapy

## 2024-08-29 ENCOUNTER — Ambulatory Visit: Admitting: Physical Therapy

## 2024-09-05 ENCOUNTER — Ambulatory Visit: Admitting: Physical Therapy

## 2024-09-05 ENCOUNTER — Ambulatory Visit: Admitting: Occupational Therapy

## 2024-09-12 ENCOUNTER — Ambulatory Visit: Admitting: Occupational Therapy

## 2024-09-12 ENCOUNTER — Ambulatory Visit: Admitting: Physical Therapy

## 2024-09-19 ENCOUNTER — Ambulatory Visit: Admitting: Physical Therapy

## 2024-09-19 ENCOUNTER — Ambulatory Visit: Admitting: Occupational Therapy

## 2024-09-26 ENCOUNTER — Ambulatory Visit: Admitting: Occupational Therapy

## 2024-09-26 ENCOUNTER — Ambulatory Visit: Admitting: Physical Therapy
